# Patient Record
Sex: Male | Born: 1985 | Race: White | Marital: Single
Health system: Southern US, Community
[De-identification: ages and names within clinical notes are randomized; demographics above are authoritative.]

---

## 2016-08-06 ENCOUNTER — Other Ambulatory Visit: Payer: Self-pay | Admitting: Occupational Medicine

## 2016-08-06 ENCOUNTER — Ambulatory Visit
Admission: RE | Admit: 2016-08-06 | Discharge: 2016-08-06 | Disposition: A | Discharge status: Home / Self Care | Payer: No Typology Code available for payment source | Source: Ambulatory Visit | Attending: Occupational Medicine | Admitting: Occupational Medicine

## 2016-08-06 DIAGNOSIS — Z021 Encounter for pre-employment examination: Secondary | ICD-10-CM

## 2020-01-02 ENCOUNTER — Other Ambulatory Visit: Payer: Self-pay

## 2020-01-02 ENCOUNTER — Encounter (HOSPITAL_COMMUNITY): Payer: Self-pay | Admitting: Emergency Medicine

## 2020-01-02 ENCOUNTER — Emergency Department (HOSPITAL_COMMUNITY): Payer: 59

## 2020-01-02 ENCOUNTER — Emergency Department (HOSPITAL_COMMUNITY)
Admission: EM | Admit: 2020-01-02 | Discharge: 2020-01-02 | Disposition: A | Discharge status: Home / Self Care | Payer: 59 | Attending: Emergency Medicine | Admitting: Emergency Medicine

## 2020-01-02 DIAGNOSIS — J1282 Pneumonia due to coronavirus disease 2019: Secondary | ICD-10-CM | POA: Insufficient documentation

## 2020-01-02 DIAGNOSIS — U071 COVID-19: Secondary | ICD-10-CM | POA: Insufficient documentation

## 2020-01-02 DIAGNOSIS — R439 Unspecified disturbances of smell and taste: Secondary | ICD-10-CM | POA: Insufficient documentation

## 2020-01-02 MED ORDER — DEXAMETHASONE 4 MG PO TABS
6.0000 mg | ORAL_TABLET | Freq: Once | ORAL | Status: AC
  Administered 2020-01-02: 17:00:00 6 mg via ORAL
  Filled 2020-01-02: qty 1

## 2020-01-02 MED ORDER — AZITHROMYCIN 250 MG PO TABS: ORAL_TABLET | ORAL | 0 refills | Status: AC

## 2020-01-02 NOTE — ED Notes (Signed)
Pt ambulated in room and SpO2 dropped to 92% at the lowest and quickly went back up to 94-96%.

## 2020-01-02 NOTE — ED Triage Notes (Signed)
Pt having shob, diarrhea. Covid+ at [facility].

## 2020-01-02 NOTE — ED Provider Notes (Signed)
[facility]   CSN: [id number] Arrival date & time: 01/02/20  1331     History Chief Complaint  Patient presents with  . covid positive  . Shortness of Breath    Zachary Christian is a 34 y.o. male with no known past medical history. Did not receive covid vaccinations.  HPI Patient presents to emergency department today with chief complaint of shortness of breath. Patient tested positive for covid x 10 days ago. Today is the last day of his quarantine. He is here because his mother wanted to be checked out. He feels as if his symptom have significantly improved towards the end of his quarantine. Patient states his covid symptoms included cough, headache, shortness of breath, diarrhea, loss of sense of taste and smell. He has been taking Nyquil, tylenol, and cough medicine for his symptoms. His diarrhea has significantly lessened, only 1 episode in the last 24 hours. No headache currently. He denies fever, chills, neck pain or stiffness, visual changes, chest pain, abdominal pain, nausea, emesis, urinary symptoms, blood in stool, rash.      History reviewed. No pertinent past medical history.  There are no problems to display for this patient.   History reviewed. No pertinent surgical history.     No family history on file.  Social History   Tobacco Use  . Smoking status: Not on file  Substance Use Topics  . Alcohol use: Not on file  . Drug use: Not on file    Home Medications Prior to Admission medications   Medication Sig Start Date End Date Taking? Authorizing Provider  azithromycin (ZITHROMAX Z-PAK) 250 MG tablet Take 2 tablets (500 mg total) by mouth daily for 1 day, THEN 1 tablet (250 mg total) daily for 4 days. 01/02/20 01/07/20  Branon Sabine, Caroleen Hamman, PA-C    Allergies    Patient has no known allergies.  Review of Systems   Review of Systems All other systems are reviewed and are negative for acute change except as  noted in the HPI.  Physical Exam Updated Vital Signs BP 128/83   Pulse 92   Temp 99.4 F (37.4 C)   Resp 19   Ht 5\' 6"  (1.676 m)   Wt 83 kg   SpO2 93%   BMI 29.54 kg/m   Physical Exam Vitals and nursing note reviewed.  Constitutional:      General: He is not in acute distress.    Appearance: He is not ill-appearing.  HENT:     Head: Normocephalic and atraumatic.     Right Ear: Tympanic membrane and external ear normal.     Left Ear: Tympanic membrane and external ear normal.     Nose: Nose normal.     Mouth/Throat:     Mouth: Mucous membranes are moist.     Pharynx: Oropharynx is clear.  Eyes:     General: No scleral icterus.       Right eye: No discharge.        Left eye: No discharge.     Extraocular Movements: Extraocular movements intact.     Conjunctiva/sclera: Conjunctivae normal.     Pupils: Pupils are equal, round, and reactive to light.  Neck:     Vascular: No JVD.     Comments: Full ROM intact without spinous process TTP. No bony stepoffs or deformities, no paraspinous muscle TTP or muscle spasms. No rigidity or meningeal signs. No bruising, erythema, or swelling.  Cardiovascular:     Rate  and Rhythm: Normal rate and regular rhythm.     Pulses: Normal pulses.          Radial pulses are 2+ on the right side and 2+ on the left side.     Heart sounds: Normal heart sounds.  Pulmonary:     Comments: Lungs clear to auscultation in all fields. Symmetric chest rise. No wheezing, rales, or rhonchi. Oxygen saturation is 96% on room air during exam. Abdominal:     Comments: Abdomen is soft, non-distended, and non-tender in all quadrants. No rigidity, no guarding. No peritoneal signs.  Musculoskeletal:        General: Normal range of motion.     Cervical back: Normal range of motion.  Skin:    General: Skin is warm and dry.     Capillary Refill: Capillary refill takes less than 2 seconds.  Neurological:     Mental Status: He is oriented to person, place, and time.      GCS: GCS eye subscore is 4. GCS verbal subscore is 5. GCS motor subscore is 6.     Comments: Fluent speech, no facial droop.  Psychiatric:        Behavior: Behavior normal.     ED Results / Procedures / Treatments   Labs (all labs ordered are listed, but only abnormal results are displayed) Labs Reviewed - No data to display  EKG EKG Interpretation  Date/Time:  Wednesday January 02 2020 15:12:53 EDT Ventricular Rate:  75 PR Interval:    QRS Duration: 87 QT Interval:  403 QTC Calculation: 451 R Axis:   55 Text Interpretation: Sinus rhythm Probable anteroseptal infarct, old Baseline wander in lead(s) V6 No old tracing to compare Confirmed by Jacalyn Lefevre [phone] on 01/02/2020 3:28:07 PM   Radiology DG Chest Portable 1 View  Result Date: 01/02/2020 CLINICAL DATA:  Dyspnea, COVID pneumonia EXAM: PORTABLE CHEST 1 VIEW COMPARISON:  08/06/2016 FINDINGS: Lung volumes are small, but are symmetric. Superimposed peripheral bilateral mid and left lower lung zone airspace infiltrates are present in keeping with changes of atypical infection in the appropriate clinical setting. No pneumothorax or pleural effusion. Cardiac size within normal limits. Pulmonary vascularity is normal. No acute bone abnormality. IMPRESSION: Multifocal pulmonary infiltrates, compatible with the given history of COVID-19 pneumonia. Electronically Signed   By: Helyn Numbers MD   On: 01/02/2020 15:16    Procedures Procedures (including critical care time)  Medications Ordered in ED Medications  dexamethasone (DECADRON) tablet 6 mg (has no administration in time range)    ED Course  I have reviewed the triage vital signs and the nursing notes.  Pertinent labs & imaging results that were available during my care of the patient were reviewed by me and considered in my medical decision making (see chart for details). Vitals:   01/02/20 1351 01/02/20 1353 01/02/20 1515 01/02/20 1600  BP:  128/83 126/74  125/80  Pulse:  92 76 85  Resp:  19 (!) 27 20  Temp:  99.4 F (37.4 C)    SpO2:  93% 94% 95%  Weight: 83 kg     Height: 5\' 6"  (1.676 m)         MDM Rules/Calculators/A&P                          History provided by patient with additional history obtained from chart review.    Patient presenting with covid symptoms after positive test x 10 day ago. He is  afebrile, normotensive. No tachycardia, oxygen saturation in triage noted to be 93 % on room air.  Exam is benign. He is very well appearing. He has normal WOB. No fever, tachypnea, tachycardia, hypoxemia. Lungs are CTAB. He ambulated in the exam room and saturation dropped to 92% and would immediately improve to 96%. No tachycardia or increased work of breathing on exam with ambulation. Chest xray shows multifocal pulmonary infiltrates. EKG without stemi.  Patient has no significant h/o immunocompromise however will cover with z-pak for possible secondary bacterial infection. Given dose of decadron while in the ED. Informed patient of xray findings. Engaged in shared decision making with patient and he feels he can manage his symptoms at home. Will give incentive spirometer and encourage him to buy pulse ox for home use. Recommend telemedicine PCP f/u in the next 2-3 days for persistent symptoms  for further guidance and symptom recheck. Pt understands signs and symptoms that would warrant return to ED.  Pt comfortable and agreeable with POC.  The patient appears reasonably screened and/or stabilized for discharge and I doubt any other medical condition or other [facility] requiring further screening, evaluation, or treatment in the ED at this time prior to discharge. The patient is safe for discharge with strict return precautions discussed. Findings and plan of care discussed with supervising physician Dr. Particia Nearing.   Zachary Christian was evaluated in Emergency Department on 01/02/2020 for the symptoms described in the history of present illness. He  was evaluated in the context of the global COVID-19 pandemic, which necessitated consideration that the patient might be at risk for infection with the SARS-CoV-2 virus that causes COVID-19. Institutional protocols and algorithms that pertain to the evaluation of patients at risk for COVID-19 are in a state of rapid change based on information released by regulatory bodies including the CDC and federal and state organizations. These policies and algorithms were followed during the patient's care in the ED.   Portions of this note were generated with Scientist, clinical (histocompatibility and immunogenetics). Dictation errors may occur despite best attempts at proofreading.   Final Clinical Impression(s) / ED Diagnoses Final diagnoses:  Pneumonia due to COVID-19 virus    Rx / DC Orders ED Discharge Orders         Ordered    azithromycin (ZITHROMAX Z-PAK) 250 MG tablet        01/02/20 1603           Sherene Sires, PA-C 01/02/20 1621    Jacalyn Lefevre, MD 01/03/20 [phone]

## 2020-01-02 NOTE — Discharge Instructions (Addendum)
Thank you for allowing [place] to care for you today.   -Prescription sent to your pharmacy for azithromycin. This is an antibiotic used to treat penumonia. Please take as prescribed  Please return to the emergency department if you have any new or worsening symptoms.  -Use a pulse ox meter to measure your oxygen when feeling short of breath. You can buy this online. -If your fever is over 100.4 despite taking tylenol or if your oxygen level drops below 94% these are reasons to return to the emergency department for further evaluation.   Medications- You can take medications to help treat your symptoms: -Tylenol for fever and body aches. Please take as prescribed on the bottle. -Over the coutner cough medicine such as mucinex, robitussin, or other brands. -Flonase or saline nasal spray for nasal congestion -Vitamins as recommended by CDC  Treatment- This is a virus and unfortunately there are no antibitotics approved to treat this virus at this time. It is important to monitor your symptoms closely: -You should have a theremometer at home to check your temperature when feeling feverish.    We recommend you self-isolate for 10 days and to inform your work/family/friends that you has the virus.  They will need to self-quarantine for 14 days to monitor for symptoms.    Again: symptoms of shortness of breath, chest pain, difficulty breathing, new onset of confusion, any symptoms that are concerning. If any of these symptoms you should come to emergency department for evaluation.   I hope you feel better soon

## 2022-02-26 IMAGING — DX DG CHEST 1V PORT
1 series · 1 of 1 positions shown · non-contrast
Comparison: 08/06/2016

CLINICAL DATA: Dyspnea, COVID pneumonia

EXAM:
PORTABLE CHEST 1 VIEW

[chest ap]
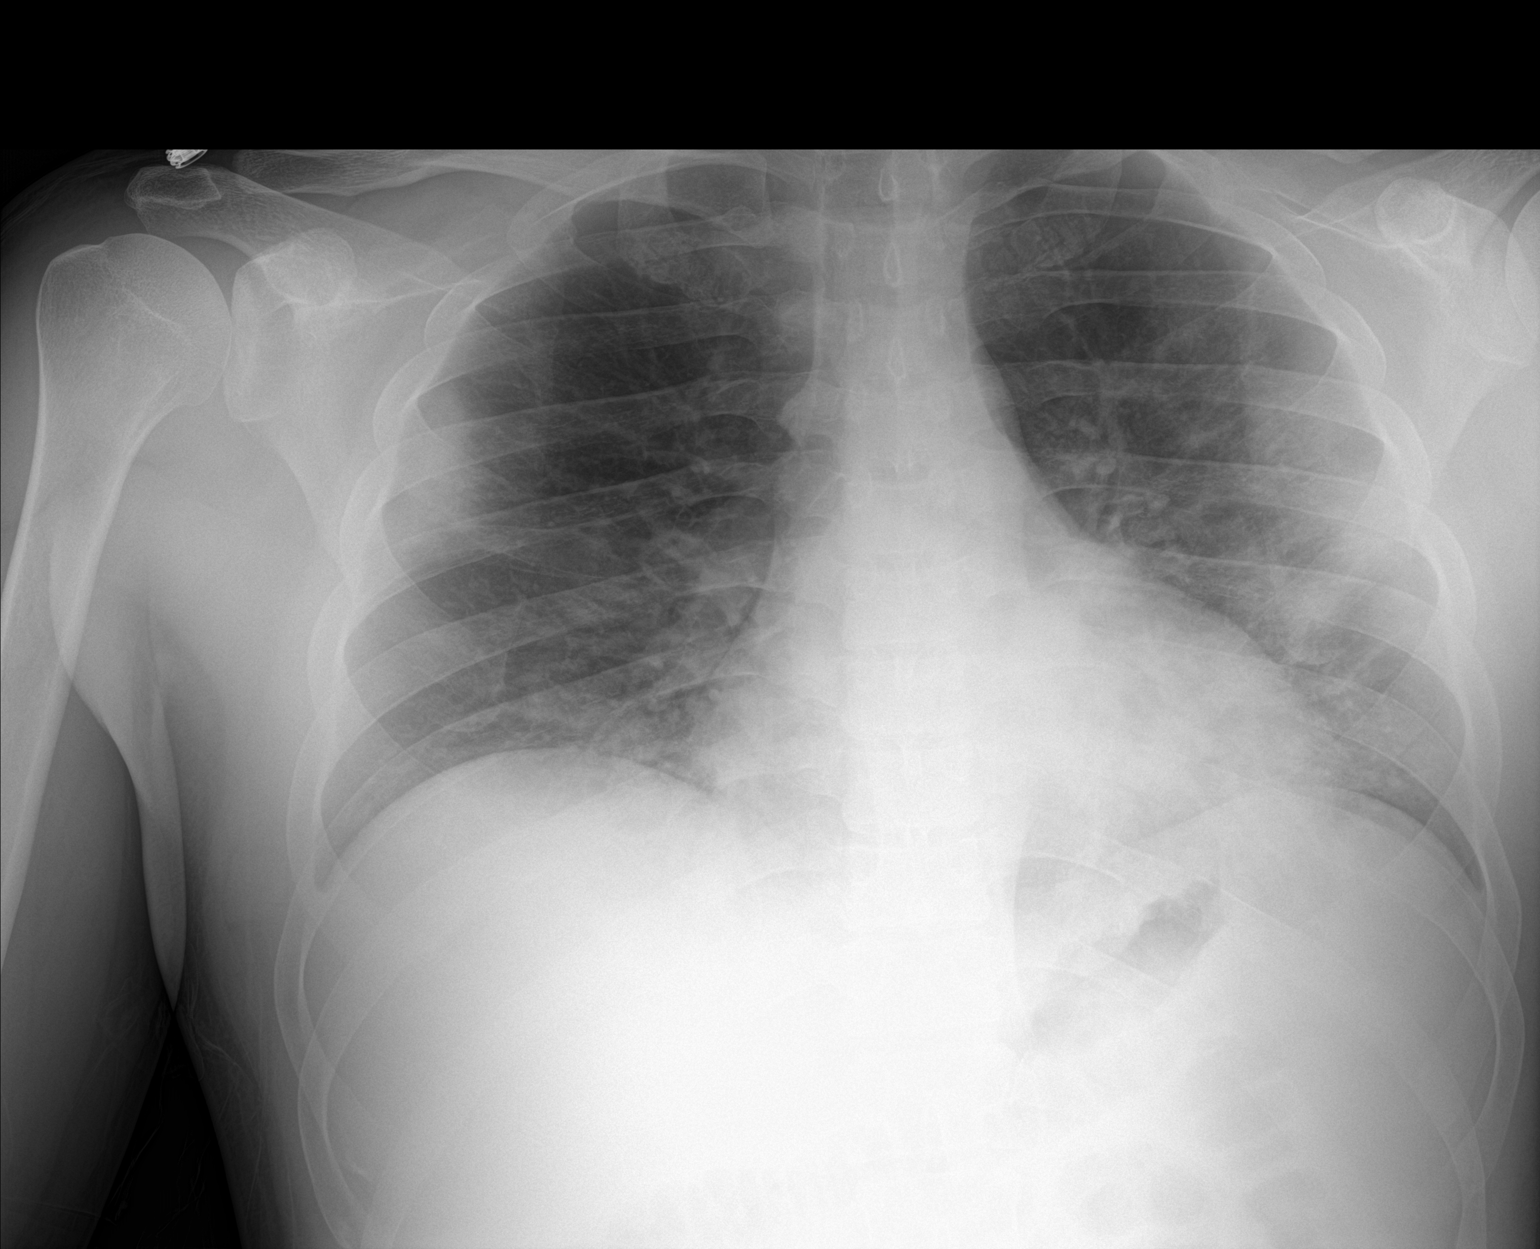

[1 of 1 positions shown; findings below may reference images not displayed]

FINDINGS: Lung volumes are small, but are symmetric. Superimposed peripheral
bilateral mid and left lower lung zone airspace infiltrates are
present in keeping with changes of atypical infection in the
appropriate clinical setting. No pneumothorax or pleural effusion.
Cardiac size within normal limits. Pulmonary vascularity is normal.
No acute bone abnormality.
IMPRESSION: Multifocal pulmonary infiltrates, compatible with the given history
of UIBWI-AK pneumonia.
